# Patient Record
Sex: Female | Born: 1965 | Race: White | Hispanic: No | Marital: Single | Smoking: Current every day smoker
Health system: Southern US, Community
[De-identification: ages and names within clinical notes are randomized; demographics above are authoritative.]

## PROBLEM LIST (undated history)

## (undated) DIAGNOSIS — I10 Essential (primary) hypertension: Secondary | ICD-10-CM

## (undated) DIAGNOSIS — F329 Major depressive disorder, single episode, unspecified: Secondary | ICD-10-CM

## (undated) DIAGNOSIS — M5430 Sciatica, unspecified side: Secondary | ICD-10-CM

## (undated) DIAGNOSIS — F909 Attention-deficit hyperactivity disorder, unspecified type: Secondary | ICD-10-CM

## (undated) DIAGNOSIS — F32A Depression, unspecified: Secondary | ICD-10-CM

## (undated) DIAGNOSIS — M5136 Other intervertebral disc degeneration, lumbar region: Secondary | ICD-10-CM

## (undated) HISTORY — PX: ECTOPIC PREGNANCY SURGERY: SHX613

## (undated) HISTORY — PX: KNEE SURGERY: SHX244

---

## 2006-11-24 ENCOUNTER — Emergency Department: Payer: Self-pay | Admitting: Emergency Medicine

## 2006-11-28 ENCOUNTER — Emergency Department: Payer: Self-pay | Admitting: Emergency Medicine

## 2007-06-26 ENCOUNTER — Emergency Department: Payer: Self-pay | Admitting: Emergency Medicine

## 2007-09-11 ENCOUNTER — Emergency Department: Payer: Self-pay | Admitting: Emergency Medicine

## 2007-09-13 ENCOUNTER — Ambulatory Visit: Payer: Self-pay | Admitting: Pain Medicine

## 2007-10-01 ENCOUNTER — Emergency Department: Payer: Self-pay | Admitting: Emergency Medicine

## 2007-10-02 ENCOUNTER — Emergency Department: Payer: Self-pay | Admitting: Emergency Medicine

## 2014-06-30 ENCOUNTER — Emergency Department: Payer: Self-pay | Admitting: Emergency Medicine

## 2014-06-30 LAB — CBC
HCT: 40.6 % (ref 35.0–47.0)
HGB: 13.2 g/dL (ref 12.0–16.0)
MCH: 26.3 pg (ref 26.0–34.0)
MCHC: 32.6 g/dL (ref 32.0–36.0)
MCV: 81 fL (ref 80–100)
Platelet: 245 10*3/uL (ref 150–440)
RBC: 5.03 10*6/uL (ref 3.80–5.20)
RDW: 14.8 % — AB (ref 11.5–14.5)
WBC: 16.9 10*3/uL — AB (ref 3.6–11.0)

## 2014-06-30 LAB — BASIC METABOLIC PANEL
ANION GAP: 10 (ref 7–16)
BUN: 15 mg/dL (ref 7–18)
CREATININE: 0.89 mg/dL (ref 0.60–1.30)
Calcium, Total: 8.7 mg/dL (ref 8.5–10.1)
Chloride: 98 mmol/L (ref 98–107)
Co2: 27 mmol/L (ref 21–32)
GLUCOSE: 133 mg/dL — AB (ref 65–99)
OSMOLALITY: 273 (ref 275–301)
POTASSIUM: 3.4 mmol/L — AB (ref 3.5–5.1)
SODIUM: 135 mmol/L — AB (ref 136–145)

## 2014-06-30 LAB — URINALYSIS, COMPLETE
BACTERIA: NONE SEEN
Bilirubin,UR: NEGATIVE
Glucose,UR: NEGATIVE mg/dL (ref 0–75)
Ketone: NEGATIVE
Leukocyte Esterase: NEGATIVE
NITRITE: NEGATIVE
Ph: 5 (ref 4.5–8.0)
Protein: 30
RBC,UR: 3 /HPF (ref 0–5)
Specific Gravity: 1.014 (ref 1.003–1.030)
Squamous Epithelial: 29

## 2014-11-08 ENCOUNTER — Encounter: Payer: Self-pay | Admitting: Emergency Medicine

## 2014-11-08 ENCOUNTER — Emergency Department
Admission: EM | Admit: 2014-11-08 | Discharge: 2014-11-08 | Disposition: A | Discharge status: Home / Self Care | Payer: Medicaid Other | Attending: Emergency Medicine | Admitting: Emergency Medicine

## 2014-11-08 DIAGNOSIS — M5430 Sciatica, unspecified side: Secondary | ICD-10-CM | POA: Insufficient documentation

## 2014-11-08 DIAGNOSIS — Z720 Tobacco use: Secondary | ICD-10-CM | POA: Insufficient documentation

## 2014-11-08 DIAGNOSIS — Z79899 Other long term (current) drug therapy: Secondary | ICD-10-CM | POA: Diagnosis not present

## 2014-11-08 DIAGNOSIS — M545 Low back pain: Secondary | ICD-10-CM | POA: Diagnosis present

## 2014-11-08 DIAGNOSIS — I10 Essential (primary) hypertension: Secondary | ICD-10-CM | POA: Insufficient documentation

## 2014-11-08 HISTORY — DX: Other intervertebral disc degeneration, lumbar region: M51.36

## 2014-11-08 HISTORY — DX: Sciatica, unspecified side: M54.30

## 2014-11-08 HISTORY — DX: Major depressive disorder, single episode, unspecified: F32.9

## 2014-11-08 HISTORY — DX: Attention-deficit hyperactivity disorder, unspecified type: F90.9

## 2014-11-08 HISTORY — DX: Essential (primary) hypertension: I10

## 2014-11-08 HISTORY — DX: Depression, unspecified: F32.A

## 2014-11-08 MED ORDER — METHYLPREDNISOLONE 4 MG PO TBPK: ORAL_TABLET | ORAL | Status: AC

## 2014-11-08 MED ORDER — HYDROMORPHONE HCL 1 MG/ML IJ SOLN
1.0000 mg | Freq: Once | INTRAMUSCULAR | Status: AC
  Administered 2014-11-08: 1 mg via INTRAMUSCULAR

## 2014-11-08 MED ORDER — DEXAMETHASONE SODIUM PHOSPHATE 10 MG/ML IJ SOLN
10.0000 mg | Freq: Once | INTRAMUSCULAR | Status: AC
  Administered 2014-11-08: 10 mg via INTRAMUSCULAR

## 2014-11-08 MED ORDER — DEXAMETHASONE SODIUM PHOSPHATE 10 MG/ML IJ SOLN
INTRAMUSCULAR | Status: AC
  Administered 2014-11-08: 10 mg via INTRAMUSCULAR
  Filled 2014-11-08: qty 1

## 2014-11-08 MED ORDER — HYDROMORPHONE HCL 1 MG/ML IJ SOLN
INTRAMUSCULAR | Status: AC
  Administered 2014-11-08: 1 mg via INTRAMUSCULAR
  Filled 2014-11-08: qty 1

## 2014-11-08 MED ORDER — ORPHENADRINE CITRATE 30 MG/ML IJ SOLN
INTRAMUSCULAR | Status: AC
  Administered 2014-11-08: 60 mg via INTRAMUSCULAR
  Filled 2014-11-08: qty 2

## 2014-11-08 MED ORDER — OXYCODONE-ACETAMINOPHEN 7.5-325 MG PO TABS: 1.0000 | ORAL_TABLET | Freq: Four times a day (QID) | ORAL | Status: DC | PRN

## 2014-11-08 MED ORDER — ORPHENADRINE CITRATE 30 MG/ML IJ SOLN
60.0000 mg | Freq: Once | INTRAMUSCULAR | Status: AC
  Administered 2014-11-08: 60 mg via INTRAMUSCULAR

## 2014-11-08 MED ORDER — CYCLOBENZAPRINE HCL 10 MG PO TABS: 10.0000 mg | ORAL_TABLET | Freq: Three times a day (TID) | ORAL | Status: AC | PRN

## 2014-11-08 NOTE — ED Notes (Signed)
Patient presents to the ED with lower back pain x 1 week.  Patient reports history of sciatica and degenerative disc disease.  Patient states activities of daily living such as showering have been difficulty.  Patient ambulatory to treatment room.

## 2014-11-08 NOTE — ED Provider Notes (Signed)
[facility] Emergency Department Provider Note  ____________________________________________  Time seen: Approximately 8:29 PM  I have reviewed the triage vital signs and the nursing notes.   HISTORY  Chief Complaint Back Pain    HPI Tammy Rocha is a 49 y.o. female patient complained of a acute low back 10 vertical component to both legs. This patient have a history of degenerative disc disease and sciatica. Patient does not follow orthopedics as directed secondary to lack of funds. Patient states she is relocating the near future and hopefully we will be able to afford evaluation by orthopedics. She denies any bladder or bowel dysfunction.   Past Medical History  Diagnosis Date  . Hypertension   . Sciatica   . Degenerative disc disease, lumbar   . Depression   . ADHD (attention deficit hyperactivity disorder)     There are no active problems to display for this patient.   Past Surgical History  Procedure Laterality Date  . Knee surgery    . Ectopic pregnancy surgery      Current Outpatient Rx  Name  Route  Sig  Dispense  Refill  . cyclobenzaprine (FLEXERIL) 10 MG tablet   Oral   Take 1 tablet (10 mg total) by mouth every 8 (eight) hours as needed for muscle spasms.   15 tablet   0   . methylPREDNISolone (MEDROL DOSEPAK) 4 MG TBPK tablet      Take Tapered dose as directed   21 tablet   0   . oxyCODONE-acetaminophen (PERCOCET) 7.5-325 MG per tablet   Oral   Take 1 tablet by mouth every 6 (six) hours as needed for severe pain.   12 tablet   0     Allergies Naproxen and Voltaren  History reviewed. No pertinent family history.  Social History History  Substance Use Topics  . Smoking status: Current Every Day Smoker -- 0.50 packs/day    Types: Cigarettes  . Smokeless tobacco: Not on file  . Alcohol Use: No    Review of Systems Constitutional: No fever/chills Eyes: No visual changes. ENT: No sore throat. Cardiovascular:  Denies chest pain. Respiratory: Denies shortness of breath. Gastrointestinal: No abdominal pain.  No nausea, no vomiting.  No diarrhea.  No constipation. Genitourinary: Negative for dysuria. Musculoskeletal: Positive for low back pain Skin: Negative for rash. Neurological: Negative for headaches, denies focal weakness but state there is some intermitting numbness to the bilateral legs.   10-point ROS otherwise negative.  ____________________________________________   PHYSICAL EXAM:  VITAL SIGNS: ED Triage Vitals  Enc Vitals Group     BP --      Pulse --      Resp --      Temp --      Temp src --      SpO2 --      Weight --      Height --      Head Cir --      Peak Flow --      Pain Score --      Pain Loc --      Pain Edu? --      Excl. in GC? --     Constitutional: Alert and oriented. Appears in moderate distress.. Eyes: Conjunctivae are normal. PERRL. EOMI. Head: Atraumatic. Nose: No congestion/rhinnorhea. Mouth/Throat: Mucous membranes are moist.  Oropharynx non-erythematous. Neck: No stridor.  Cervical spine is nontender with full nuchal range of motion of the neck. Hematological/Lymphatic/Immunilogical: No cervical lymphadenopathy. Cardiovascular: Normal rate,  regular rhythm. Grossly normal heart sounds.  Good peripheral circulation. Blood pressure elevated 180/90. Respiratory: Normal respiratory effort.  No retractions. Lungs CTAB. Gastrointestinal: Soft and nontender. No distention. No abdominal bruits. No CVA tenderness. Musculoskeletal: No lower extremity tenderness nor edema.  No joint effusions. No obvious spinal deformity. Decreased range of motion's all fields. Patient has bilateral straight leg raise is approximately 70. Patient last heavily on upper extremities for sitting and standing. Neurologic:  Normal speech and language. No gross focal neurologic deficits are appreciated. Speech is normal. Atypical gait. Skin:  Skin is warm, dry and intact. No rash  noted. Psychiatric: Mood and affect are normal. Speech and behavior are normal.  ____________________________________________   LABS (all labs ordered are listed, but only abnormal results are displayed)  Labs Reviewed - No data to display ____________________________________________  EKG   ____________________________________________  RADIOLOGY   ____________________________________________   PROCEDURES  Procedure(s) performed: None  Critical Care performed: No  ____________________________________________   INITIAL IMPRESSION / ASSESSMENT AND PLAN / ED COURSE  Pertinent labs & imaging results that were available during my care of the patient were reviewed by me and considered in my medical decision making (see chart for details).  Sciatica and hypertension. ____________________________________________   FINAL CLINICAL IMPRESSION(S) / ED DIAGNOSES  Final diagnoses:  Sciatic leg pain  Hypertension, essential      Joni ReiningRonald K Braelynne Garinger, PA-C 11/08/14 2051  Myrna Blazeravid Matthew Schaevitz, MD 11/08/14 2128

## 2014-11-19 ENCOUNTER — Emergency Department
Admission: EM | Admit: 2014-11-19 | Discharge: 2014-11-19 | Disposition: A | Discharge status: Home / Self Care | Payer: Medicaid Other | Attending: Emergency Medicine | Admitting: Emergency Medicine

## 2014-11-19 DIAGNOSIS — Z720 Tobacco use: Secondary | ICD-10-CM | POA: Insufficient documentation

## 2014-11-19 DIAGNOSIS — M5137 Other intervertebral disc degeneration, lumbosacral region: Secondary | ICD-10-CM | POA: Insufficient documentation

## 2014-11-19 DIAGNOSIS — I10 Essential (primary) hypertension: Secondary | ICD-10-CM | POA: Insufficient documentation

## 2014-11-19 DIAGNOSIS — M545 Low back pain: Secondary | ICD-10-CM | POA: Diagnosis present

## 2014-11-19 DIAGNOSIS — M5430 Sciatica, unspecified side: Secondary | ICD-10-CM | POA: Diagnosis not present

## 2014-11-19 MED ORDER — PROMETHAZINE HCL 25 MG/ML IJ SOLN
INTRAMUSCULAR | Status: AC
  Administered 2014-11-19: 25 mg via INTRAMUSCULAR
  Filled 2014-11-19: qty 1

## 2014-11-19 MED ORDER — PROMETHAZINE HCL 25 MG/ML IJ SOLN
25.0000 mg | Freq: Once | INTRAMUSCULAR | Status: AC
Start: 2014-11-19 — End: 2014-11-19
  Administered 2014-11-19: 25 mg via INTRAMUSCULAR

## 2014-11-19 MED ORDER — OXYCODONE-ACETAMINOPHEN 5-325 MG PO TABS: 1.0000 | ORAL_TABLET | ORAL | Status: AC | PRN

## 2014-11-19 MED ORDER — METHOCARBAMOL 500 MG PO TABS: 500.0000 mg | ORAL_TABLET | Freq: Four times a day (QID) | ORAL | Status: AC | PRN

## 2014-11-19 MED ORDER — MEPERIDINE HCL 25 MG/ML IJ SOLN
INTRAMUSCULAR | Status: AC
  Administered 2014-11-19: 25 mg via INTRAMUSCULAR
  Filled 2014-11-19: qty 1

## 2014-11-19 MED ORDER — MEPERIDINE HCL 25 MG/ML IJ SOLN
25.0000 mg | Freq: Once | INTRAMUSCULAR | Status: AC
  Administered 2014-11-19: 25 mg via INTRAMUSCULAR

## 2014-11-19 NOTE — ED Provider Notes (Signed)
[facility] Emergency Department Provider Note  ____________________________________________  Time seen: Approximately 7:24 AM  I have reviewed the triage vital signs and the nursing notes.   HISTORY  Chief Complaint Back Pain   HPI Tammy Rocha is a 49 y.o. female blinds of low back pain on and off for at least 5 months. States that she is moving in his appointment with orthopedics on June 22. This is seen here on May 19 for same on for the same reason. Notes reviewed. No other complaints other than continuous low back pain radiating down her left diagnosed with sciatica in the past with DDD.   Past Medical History  Diagnosis Date  . Hypertension   . Sciatica   . Degenerative disc disease, lumbar   . Depression   . ADHD (attention deficit hyperactivity disorder)     There are no active problems to display for this patient.   Past Surgical History  Procedure Laterality Date  . Knee surgery    . Ectopic pregnancy surgery      Current Outpatient Rx  Name  Route  Sig  Dispense  Refill  . cyclobenzaprine (FLEXERIL) 10 MG tablet   Oral   Take 1 tablet (10 mg total) by mouth every 8 (eight) hours as needed for muscle spasms.   15 tablet   0   . methocarbamol (ROBAXIN) 500 MG tablet   Oral   Take 1 tablet (500 mg total) by mouth every 6 (six) hours as needed for muscle spasms.   30 tablet   0   . methylPREDNISolone (MEDROL DOSEPAK) 4 MG TBPK tablet      Take Tapered dose as directed   21 tablet   0   . oxyCODONE-acetaminophen (ROXICET) 5-325 MG per tablet   Oral   Take 1-2 tablets by mouth every 4 (four) hours as needed for severe pain.   15 tablet   0     Allergies Naproxen and Voltaren  No family history on file.  Social History History  Substance Use Topics  . Smoking status: Current Every Day Smoker -- 0.50 packs/day    Types: Cigarettes  . Smokeless tobacco: Not on file  . Alcohol Use: No    Review of  Systems Constitutional: No fever/chills Eyes: No visual changes. ENT: No sore throat. Cardiovascular: Denies chest pain. Respiratory: Denies shortness of breath. Gastrointestinal: No abdominal pain.  No nausea, no vomiting.  No diarrhea.  No constipation. Genitourinary: Negative for dysuria. Musculoskeletal: Negative for back pain. Skin: Negative for rash. Neurological: Negative for headaches, focal weakness or numbness.  10-point ROS otherwise negative.  ____________________________________________   PHYSICAL EXAM:  VITAL SIGNS: ED Triage Vitals  Enc Vitals Group     BP 11/19/14 0043 186/96 mmHg     Pulse Rate 11/19/14 0043 109     Resp 11/19/14 0043 20     Temp 11/19/14 0043 98.1 F (36.7 C)     Temp Source 11/19/14 0043 Oral     SpO2 11/19/14 0043 100 %     Weight 11/19/14 0043 165 lb (74.844 kg)     Height 11/19/14 0043 5\' 4"  (1.626 m)     Head Cir --      Peak Flow --      Pain Score 11/19/14 0043 7     Pain Loc --      Pain Edu? --      Excl. in GC? --     Constitutional: Alert and oriented. Well appearing  and in no acute distress. Eyes: Conjunctivae are normal. PERRL. EOMI. Head: Atraumatic. Nose: No congestion/rhinnorhea. Mouth/Throat: Mucous membranes are moist.  Oropharynx non-erythematous. Neck: No stridor.   Cardiovascular: Normal rate, regular rhythm. Grossly normal heart sounds.  Good peripheral circulation. Respiratory: Normal respiratory effort.  No retractions. Lungs CTAB. Gastrointestinal: Soft and nontender. No distention. No abdominal bruits. No CVA tenderness. Musculoskeletal: No lower extremity tenderness nor edema.  No joint effusions. Neurologic:  Normal speech and language. No gross focal neurologic deficits are appreciated. Speech is normal. No gait instability. Skin:  Skin is warm, dry and intact. No rash noted. Psychiatric: Mood and affect are normal. Speech and behavior are normal.  ____________________________________________    LABS (all labs ordered are listed, but only abnormal results are displayed)  Labs Reviewed - No data to display ____________________________________________  EKG  Not applicable ____________________________________________  RADIOLOGY  None ____________________________________________   PROCEDURES  Procedure(s) performed: None  Critical Care performed: No  ____________________________________________   INITIAL IMPRESSION / ASSESSMENT AND PLAN / ED COURSE  Pertinent labs & imaging results that were available during my care of the patient were reviewed by me and considered in my medical decision making (see chart for details).  Patient diagnosed with degenerative disc disease and sciatica. Currently moving from the area has a follow-up with orthopedics on June 22. Verbalizes understanding that the ER is not the place to obtain chronic recurrent medications for back pain. We'll treat today with Percocet 5, Robaxin-750. Recently on prednisone will not restart that. ____________________________________________   FINAL CLINICAL IMPRESSION(S) / ED DIAGNOSES  Final diagnoses:  DDD (degenerative disc disease), lumbosacral  Sciatica associated with disorder of lumbosacral spine, unspecified laterality      Evangeline Dakin, PA-C 11/19/14 1610  Phineas Semen, MD 11/19/14 1029

## 2014-11-19 NOTE — ED Notes (Signed)
Pt presents to ER stating back pain, hx of same.

## 2014-11-19 NOTE — ED Notes (Signed)
Lower back pain for several days ..states numbness to both legs  Denies any injury    ambs well to treatment area   Has appt with ortho on 6/22

## 2014-11-19 NOTE — Discharge Instructions (Signed)
Degenerative Disk Disease  Degenerative disk disease is a condition caused by the changes that occur in the cushions of the backbone (spinal disks) as you grow older. Spinal disks are soft and compressible disks located between the bones of the spine (vertebrae). They act like shock absorbers. Degenerative disk disease can affect the whole spine. However, the neck and lower back are most commonly affected. Many changes can occur in the spinal disks with aging, such as:   The spinal disks may dry and shrink.   Small tears may occur in the tough, outer covering of the disk (annulus).   The disk space may become smaller due to loss of water.   Abnormal growths in the bone (spurs) may occur. This can put pressure on the nerve roots exiting the spinal canal, causing pain.   The spinal canal may become narrowed.  CAUSES   Degenerative disk disease is a condition caused by the changes that occur in the spinal disks with aging. The exact cause is not known, but there is a genetic basis for many patients. Degenerative changes can occur due to loss of fluid in the disk. This makes the disk thinner and reduces the space between the backbones. Small cracks can develop in the outer layer of the disk. This can lead to the breakdown of the disk. You are more likely to get degenerative disk disease if you are overweight. Smoking cigarettes and doing heavy work such as weightlifting can also increase your risk of this condition. Degenerative changes can start after a sudden injury. Growth of bone spurs can compress the nerve roots and cause pain.   SYMPTOMS   The symptoms vary from person to person. Some people may have no pain, while others have severe pain. The pain may be so severe that it can limit your activities. The location of the pain depends on the part of your backbone that is affected. You will have neck or arm pain if a disk in the neck area is affected. You will have pain in your back, buttocks, or legs if a disk  in the lower back is affected. The pain becomes worse while bending, reaching up, or with twisting movements. The pain may start gradually and then get worse as time passes. It may also start after a major or minor injury. You may feel numbness or tingling in the arms or legs.   DIAGNOSIS   Your caregiver will ask you about your symptoms and about activities or habits that may cause the pain. He or she may also ask about any injuries, diseases, or treatments you have had earlier. Your caregiver will examine you to check for the range of movement that is possible in the affected area, to check for strength in your extremities, and to check for sensation in the areas of the arms and legs supplied by different nerve roots. An X-ray of the spine may be taken. Your caregiver may suggest other imaging tests, such as magnetic resonance imaging (MRI), if needed.   TREATMENT   Treatment includes rest, modifying your activities, and applying ice and heat. Your caregiver may prescribe medicines to reduce your pain and may ask you to do some exercises to strengthen your back. In some cases, you may need surgery. You and your caregiver will decide on the treatment that is best for you.  HOME CARE INSTRUCTIONS    Follow proper lifting and walking techniques as advised by your caregiver.   Maintain good posture.   Exercise regularly   as advised.   Perform relaxation exercises.   Change your sitting, standing, and sleeping habits as advised. Change positions frequently.   Lose weight as advised.   Stop smoking if you smoke.   Wear supportive footwear.  SEEK MEDICAL CARE IF:   Your pain does not go away within 1 to 4 weeks.  SEEK IMMEDIATE MEDICAL CARE IF:    Your pain is severe.   You notice weakness in your arms, hands, or legs.   You begin to lose control of your bladder or bowel movements.  MAKE SURE YOU:    Understand these instructions.   Will watch your condition.   Will get help right away if you are not doing  well or get worse.  Document Released: 04/05/2007 Document Revised: 08/31/2011 Document Reviewed: 10/10/2013  [employer] Patient Information 2015 [employer]. This information is not intended to replace advice given to you by your health care provider. Make sure you discuss any questions you have with your health care provider.            Sciatica  Sciatica is pain, weakness, numbness, or tingling along the path of the sciatic nerve. The nerve starts in the lower back and runs down the back of each leg. The nerve controls the muscles in the lower leg and in the back of the knee, while also providing sensation to the back of the thigh, lower leg, and the sole of your foot. Sciatica is a symptom of another medical condition. For instance, nerve damage or certain conditions, such as a herniated disk or bone spur on the spine, pinch or put pressure on the sciatic nerve. This causes the pain, weakness, or other sensations normally associated with sciatica. Generally, sciatica only affects one side of the body.  CAUSES    Herniated or slipped disc.   Degenerative disk disease.   A pain disorder involving the narrow muscle in the buttocks (piriformis syndrome).   Pelvic injury or fracture.   Pregnancy.   Tumor (rare).  SYMPTOMS   Symptoms can vary from mild to very severe. The symptoms usually travel from the low back to the buttocks and down the back of the leg. Symptoms can include:   Mild tingling or dull aches in the lower back, leg, or hip.   Numbness in the back of the calf or sole of the foot.   Burning sensations in the lower back, leg, or hip.   Sharp pains in the lower back, leg, or hip.   Leg weakness.   Severe back pain inhibiting movement.  These symptoms may get worse with coughing, sneezing, laughing, or prolonged sitting or standing. Also, being overweight may worsen symptoms.  DIAGNOSIS   Your caregiver will perform a physical exam to look for common symptoms of sciatica. He or she may ask  you to do certain movements or activities that would trigger sciatic nerve pain. Other tests may be performed to find the cause of the sciatica. These may include:   Blood tests.   X-rays.   Imaging tests, such as an MRI or CT scan.  TREATMENT   Treatment is directed at the cause of the sciatic pain. Sometimes, treatment is not necessary and the pain and discomfort goes away on its own. If treatment is needed, your caregiver may suggest:   Over-the-counter medicines to relieve pain.   Prescription medicines, such as anti-inflammatory medicine, muscle relaxants, or narcotics.   Applying heat or ice to the painful area.   Steroid injections   to lessen pain, irritation, and inflammation around the nerve.   Reducing activity during periods of pain.   Exercising and stretching to strengthen your abdomen and improve flexibility of your spine. Your caregiver may suggest losing weight if the extra weight makes the back pain worse.   Physical therapy.   Surgery to eliminate what is pressing or pinching the nerve, such as a bone spur or part of a herniated disk.  HOME CARE INSTRUCTIONS    Only take over-the-counter or prescription medicines for pain or discomfort as directed by your caregiver.   Apply ice to the affected area for 20 minutes, 3-4 times a day for the first 48-72 hours. Then try heat in the same way.   Exercise, stretch, or perform your usual activities if these do not aggravate your pain.   Attend physical therapy sessions as directed by your caregiver.   Keep all follow-up appointments as directed by your caregiver.   Do not wear high heels or shoes that do not provide proper support.   Check your mattress to see if it is too soft. A firm mattress may lessen your pain and discomfort.  SEEK IMMEDIATE MEDICAL CARE IF:    You lose control of your bowel or bladder (incontinence).   You have increasing weakness in the lower back, pelvis, buttocks, or legs.   You have redness or swelling of your  back.   You have a burning sensation when you urinate.   You have pain that gets worse when you lie down or awakens you at night.   Your pain is worse than you have experienced in the past.   Your pain is lasting longer than 4 weeks.   You are suddenly losing weight without reason.  MAKE SURE YOU:   Understand these instructions.   Will watch your condition.   Will get help right away if you are not doing well or get worse.  Document Released: 06/02/2001 Document Revised: 12/08/2011 Document Reviewed: 10/18/2011  [employer] Patient Information 2015 [employer]. This information is not intended to replace advice given to you by your health care provider. Make sure you discuss any questions you have with your health care provider.

## 2016-03-17 IMAGING — CT CT ABD-PELV W/O CM
2 of 4 series · 16 of 46 positions shown, 18 images · non-contrast
Comparison: None.

CLINICAL DATA: Right flank and back pain for 1 day

EXAM:
CT ABDOMEN AND PELVIS WITHOUT CONTRAST
TECHNIQUE: Multidetector CT imaging of the abdomen and pelvis was performed
following the standard protocol without oral or intravenous contrast
material administration.

[Series 2: stone standard full · axial · 0.68mm/px · z∈[-532,-126]mm · 13 of 89 slices shown, 15 images]
[im 4/89  soft-tissue]
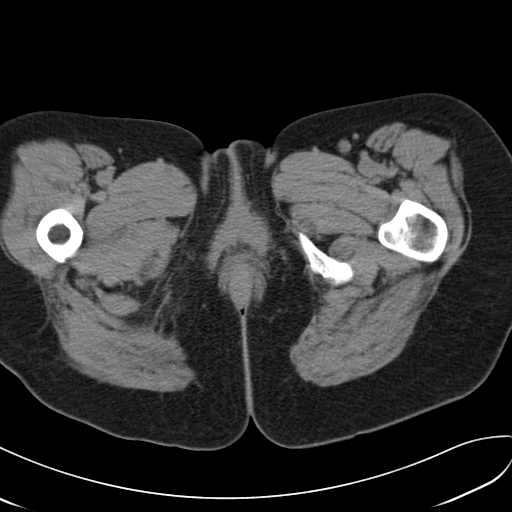
[im 4/89  bone]
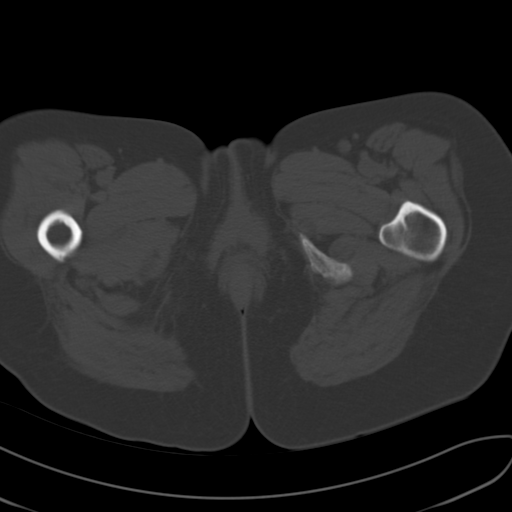
[im 11/89  soft-tissue]
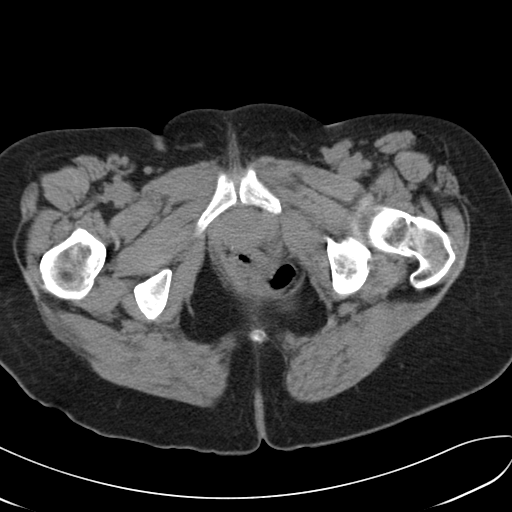
[im 18/89  soft-tissue]
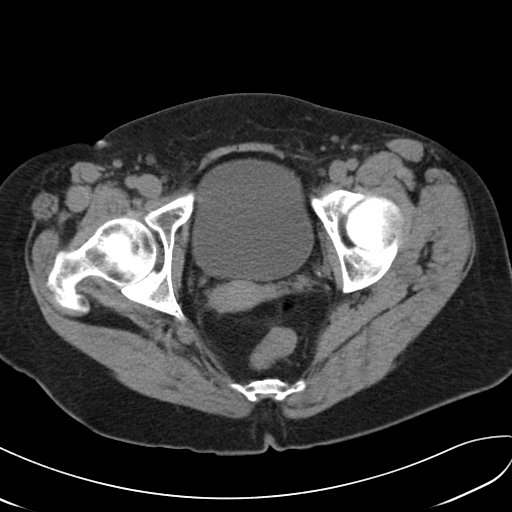
[im 25/89  soft-tissue]
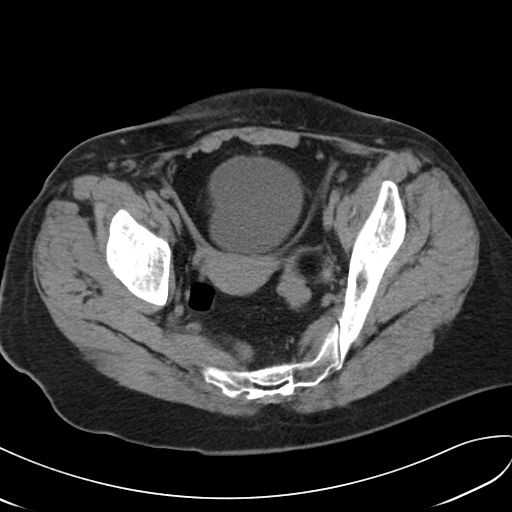
[im 32/89  soft-tissue]
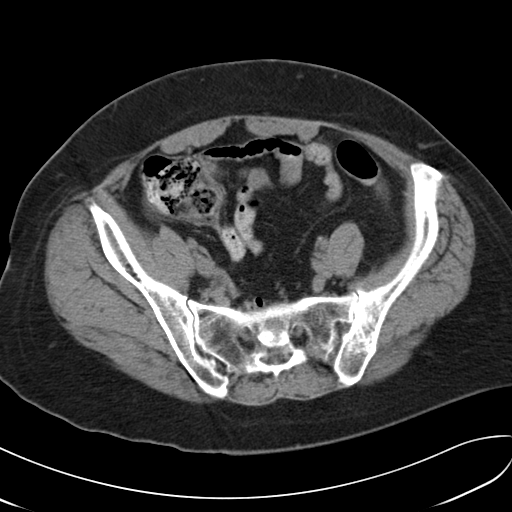
[im 39/89  soft-tissue]
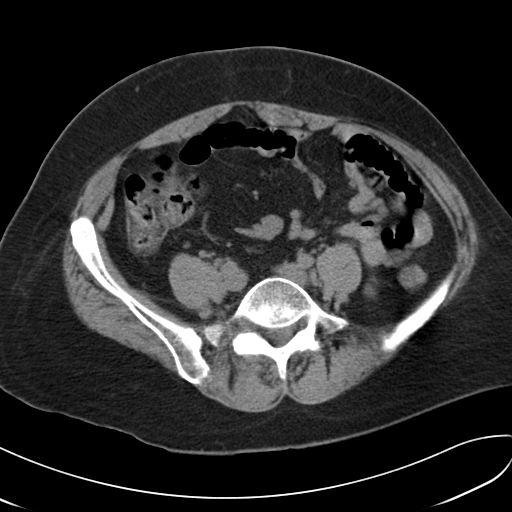
[im 46/89  soft-tissue]
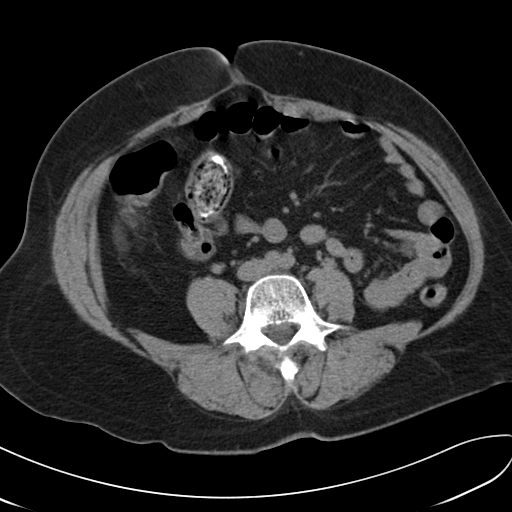
[im 50/89  soft-tissue]
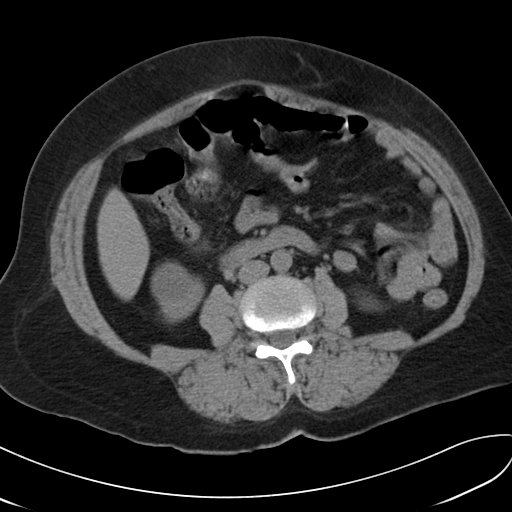
[im 57/89  soft-tissue]
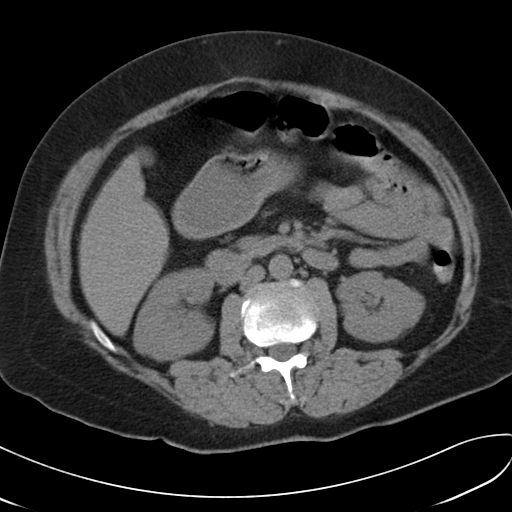
[im 57/89  bone]
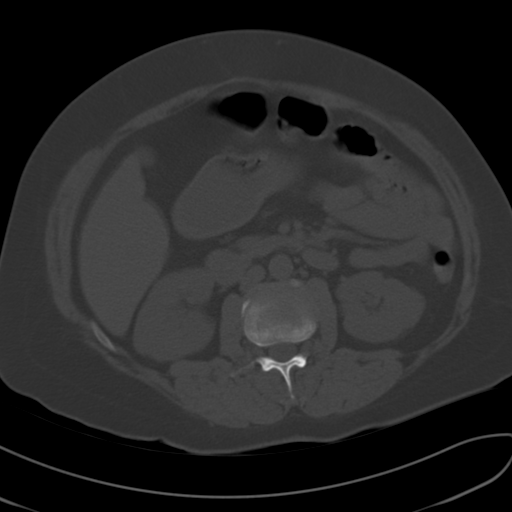
[im 64/89  soft-tissue]
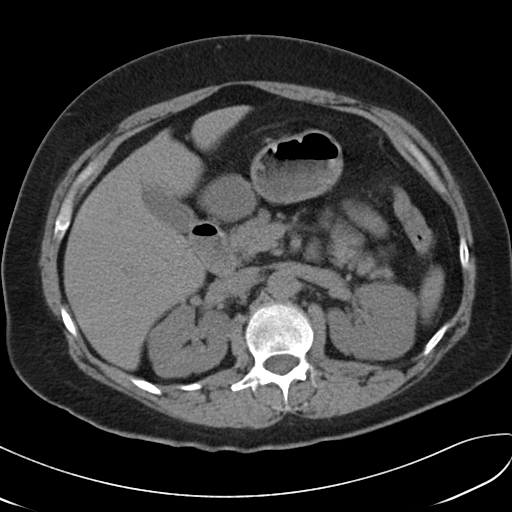
[im 71/89  soft-tissue]
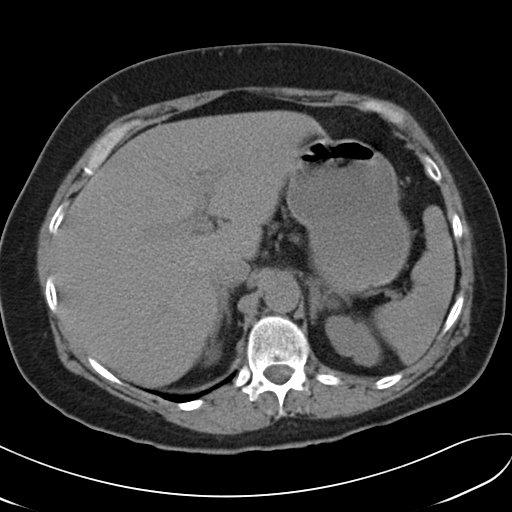
[im 78/89  soft-tissue]
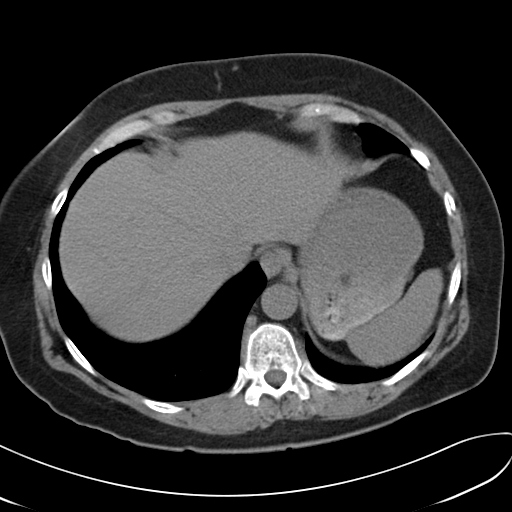
[im 85/89  soft-tissue]
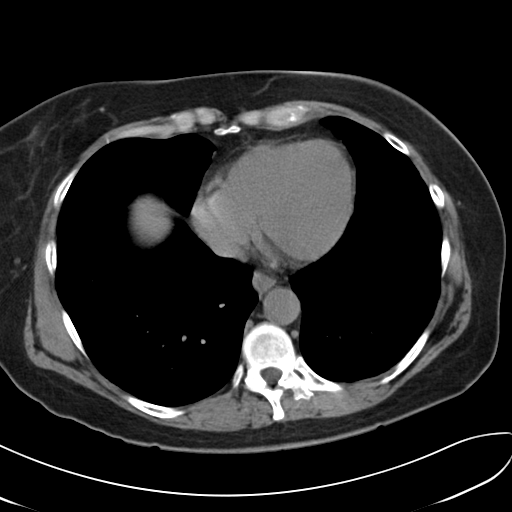

[Series 5: cor stone standard full · coronal · 0.70mm/px · 3 of 135 slices shown]
[im 45/135  soft-tissue]
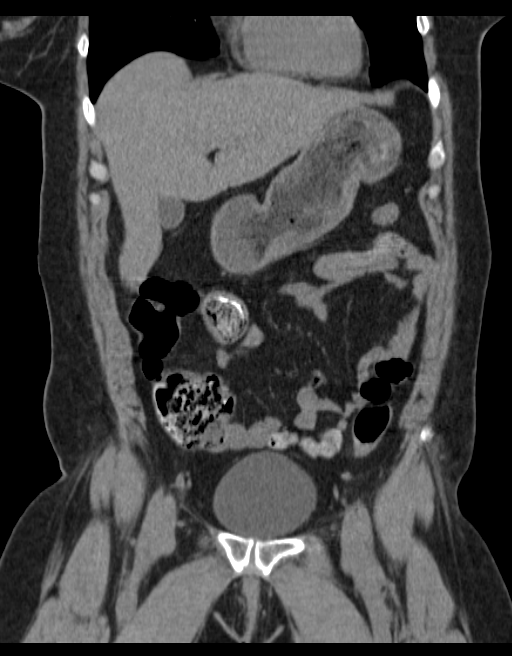
[im 60/135  soft-tissue]
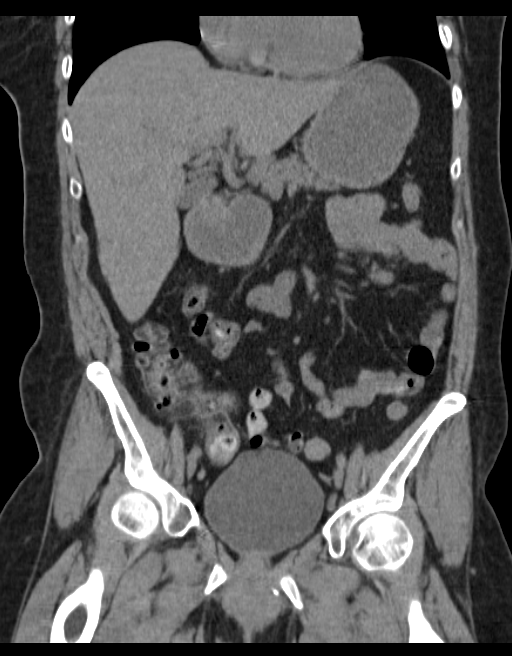
[im 75/135  soft-tissue]
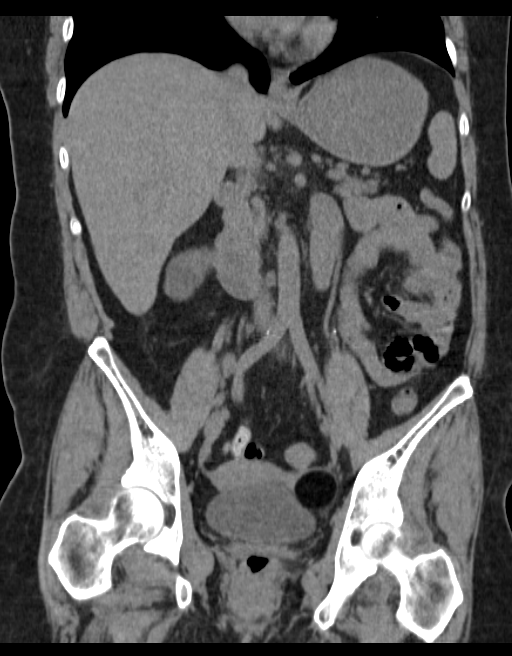

[16 of 46 positions shown; findings below may reference images not displayed]

FINDINGS: Lung bases are clear.

Liver is prominent, measuring 19.4 cm in length. No focal liver
lesions are identified on this noncontrast enhanced study.
Gallbladder wall is not appreciably thickened. There is no biliary
duct dilatation.

Spleen, pancreas, and right adrenal appear normal. There is mild
generalized left adrenal hypertrophy.

There is a cyst in the lower pole of the right kidney anteriorly
measuring 3.0 x 3.3 cm. There is no other renal mass appreciable on
this noncontrast enhanced study. There is no hydronephrosis on
either side. There is no appreciable renal or ureteral calculus on
either side.

In the pelvis, the urinary bladder is midline with normal wall
thickness. There is a left ovarian dermoid which is primarily fat
containing measuring 3.6 x 3.3 x 3.3 cm in size. No other pelvic
mass is seen. There is no pelvic fluid collection. Appendix appears
normal. Terminal ileum appears within normal limits. There is
radiopaque material in the proximal transverse colon which may have
postoperative etiology. The lumen appears patent in this area.

There is no bowel obstruction. No free air or portal venous air.
There is no demonstrable ascites, adenopathy, or abscess in the
abdomen or pelvis. There is no demonstrable abdominal aortic
aneurysm. There are no blastic or lytic bone lesions.
IMPRESSION: No bowel obstruction. No abscess. Question postoperative change in
the proximal transverse colon with lumen patent in this area.

Left ovarian dermoid measuring 3.6 x 3.3 x 3.3 cm in size.

No renal or ureteral calculus. No hydronephrosis. There is a right
renal cyst.

Left adrenal hypertrophy without focal left adrenal mass
appreciable.

Liver prominent without focal lesion.
